# Patient Record
Sex: Female | Born: 2005 | Race: Black or African American | Hispanic: No | Marital: Single | Smoking: Current every day smoker
Health system: Southern US, Community
[De-identification: ages and names within clinical notes are randomized; demographics above are authoritative.]

## PROBLEM LIST (undated history)

## (undated) DIAGNOSIS — D649 Anemia, unspecified: Secondary | ICD-10-CM

## (undated) DIAGNOSIS — J45909 Unspecified asthma, uncomplicated: Secondary | ICD-10-CM

## (undated) DIAGNOSIS — Z8619 Personal history of other infectious and parasitic diseases: Secondary | ICD-10-CM

## (undated) HISTORY — PX: NO PAST SURGERIES: SHX2092

## (undated) HISTORY — DX: Personal history of other infectious and parasitic diseases: Z86.19

## (undated) HISTORY — PX: OTHER SURGICAL HISTORY: SHX169

---
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
## 2024-01-03 ENCOUNTER — Encounter: Payer: Medicaid Other | Admitting: Certified Nurse Midwife

## 2024-01-07 ENCOUNTER — Encounter: Payer: Self-pay | Admitting: Certified Nurse Midwife

## 2024-01-16 NOTE — Progress Notes (Signed)
 Chief Complaint:    Sonya Adams is a 18 y.o. female here for Nexplanono removal and discuss sono . Pt here for follow up for abd pain  and nexplanon removal and contraception counseling   u/s today :  Uterus ======   Visualized. Size 92 mm x 50 mm x 38 mm Normal Position: anteverted Malformations: none Myometrium: appears normal Endometrium: appears normal/appropriate. Endometrial thickness, total 5.1 mm Cervix details: nabothian cysts visualized No fibroids identified No polyps identified   Right Ovary =========   Visualized, Enlarged. Outline: Normal. Morphology: Appropriate. Size 44 mm x 28 mm x 22 mm No cysts identified Follicles identified   Left Ovary ========   Visualized, Enlarged. Outline: Normal. Morphology: Appropriate. Size 44 mm x 32 mm x 31 mm Follicles identified Cyst(s)    Size 36.0 mm x 23.0 mm x 22 mm. Mean 27.0 mm. Vol 9.538 cm. Simple cyst   Cul de Sac =========   Normal. No free fluid visualized   Normal. No free fluid visualized    Past Medical History:  has a past medical history of Asthma without status asthmaticus (HHS-HCC).  Past Surgical History:  has a past surgical history that includes LTCS (05/31/2022). Family History: family history is not on file. Social History:  reports that she has never smoked. She has never used smokeless tobacco. She reports that she does not currently use alcohol. She reports that she does not use drugs. OB/GYN History:  OB History     Gravida  1   Para  1   Term  1   Preterm      AB      Living  1      SAB      IAB      Ectopic      Molar      Multiple      Live Births  1          Allergies: has No Known Allergies. Medications:  Current Outpatient Medications:  .  etonogestreL (NEXPLANON) 68 mg implant, Inject 1 each into the skin once Placed 07/13/22, Disp: , Rfl:  .  hyoscyamine (LEVSIN) 0.125 mg tablet, 1 tab po q 4-8 hours prn, Disp: 30 tablet, Rfl: 0 .  albuterol  (PROVENTIL) 2.5 mg /3 mL (0.083 %) nebulizer solution, , Disp: , Rfl: 0 .  levonorgestrel-ethinyl estradiol (SEASONALE) 0.15 mg-30 mcg (91) tablet, Take 1 tablet by mouth once daily, Disp: 84 tablet, Rfl: 3 .  NIFEdipine (PROCARDIA-XL) 30 MG (OSM) XL tablet, Take 1 tablet (30 mg total) by mouth once daily (Patient not taking: Reported on 06/15/2022), Disp: 30 tablet, Rfl: 11 .  ondansetron  (ZOFRAN ) 4 MG tablet, Take 1 tablet (4 mg total) by mouth every 8 (eight) hours as needed for Nausea (Patient not taking: Reported on 01/13/2022), Disp: 30 tablet, Rfl: 0 .  oxyCODONE  (ROXICODONE ) 5 MG immediate release tablet, TAKE 1 TABLET BY MOUTH EVERY 4 HOURS AS NEEDED FOR UP TO 7 DAYS FOR MODERATE PAIN OR SEVERE PAIN. (Patient not taking: Reported on 07/13/2022), Disp: , Rfl:  .  prenatal vitamin with iron -folic acid (PRENATAL TABLETS) tablet, Take 1 tablet by mouth once daily (Patient not taking: Reported on 05/01/2022), Disp: , Rfl:   Review of Systems: General:   No fatigue or weight loss Eyes:   No vision changes Ears:   No hearing difficulty Respiratory:                No cough or shortness of breath  Pulmonary:   No asthma or shortness of breath Cardiovascular:        No chest pain, palpitations, dyspnea on exertion Gastrointestinal:          No abdominal bloating, chronic diarrhea, constipations, masses, pain or hematochezia Genitourinary:  No hematuria, dysuria, abnormal vaginal discharge, pelvic pain, Menometrorrhagia Lymphatic:  No swollen lymph nodes Musculoskeletal: No muscle weakness Neurologic:  No extremity weakness, syncope, seizure disorder Psychiatric:  No history of depression, delusions or suicidal/homicidal ideation    Exam:   Vitals:   01/16/24 1528  BP: 108/77  Pulse: 71    There is no height or weight on file to calculate BMI.  WDWN black female in NAD   Lungs: CTA  CV : RRR without murmur    Consent signed : /nexplanon removal: betadine  prep , injected with 1% lidocaine    With epinephrine  . 5 mm incision with #11 blade . Device retreived some  difficulty. ++ scarring around implant . Removed intact .Steri strips and bandaid placed    Impression:   The primary encounter diagnosis was Nexplanon removal. Diagnoses of Counseling for birth control, oral contraceptives, Abdominal pain, recurrent, and Left ovarian cyst were also pertinent to this visit.  Simple left ovarian cyst - benign appearing   Abdominal pain improving now on miralax   Plan:   Nexplanon removal as above  Start Seasonale ( no contraindication to use ) use discussed  Condoms first 30 days  Wound care discussed   No orders of the defined types were placed in this encounter.   No follow-ups on file.  Sonya CLARYCE DINSMORE, MD

## 2024-06-23 ENCOUNTER — Other Ambulatory Visit: Payer: Self-pay

## 2024-06-23 ENCOUNTER — Emergency Department

## 2024-06-23 ENCOUNTER — Inpatient Hospital Stay
Admission: EM | Admit: 2024-06-23 | Discharge: 2024-06-26 | DRG: 872 | Disposition: A | Discharge status: Home / Self Care | Attending: Student | Admitting: Student

## 2024-06-23 DIAGNOSIS — D509 Iron deficiency anemia, unspecified: Secondary | ICD-10-CM | POA: Diagnosis present

## 2024-06-23 DIAGNOSIS — N10 Acute tubulo-interstitial nephritis: Secondary | ICD-10-CM | POA: Diagnosis present

## 2024-06-23 DIAGNOSIS — Z98891 History of uterine scar from previous surgery: Secondary | ICD-10-CM

## 2024-06-23 DIAGNOSIS — E876 Hypokalemia: Secondary | ICD-10-CM | POA: Diagnosis present

## 2024-06-23 DIAGNOSIS — E559 Vitamin D deficiency, unspecified: Secondary | ICD-10-CM | POA: Diagnosis present

## 2024-06-23 DIAGNOSIS — B9620 Unspecified Escherichia coli [E. coli] as the cause of diseases classified elsewhere: Secondary | ICD-10-CM | POA: Diagnosis present

## 2024-06-23 DIAGNOSIS — N12 Tubulo-interstitial nephritis, not specified as acute or chronic: Principal | ICD-10-CM | POA: Diagnosis present

## 2024-06-23 DIAGNOSIS — A419 Sepsis, unspecified organism: Principal | ICD-10-CM | POA: Diagnosis present

## 2024-06-23 DIAGNOSIS — F17290 Nicotine dependence, other tobacco product, uncomplicated: Secondary | ICD-10-CM | POA: Diagnosis present

## 2024-06-23 LAB — BASIC METABOLIC PANEL WITH GFR
Anion gap: 9 (ref 5–15)
BUN: 12 mg/dL (ref 6–20)
CO2: 22 mmol/L (ref 22–32)
Calcium: 8.8 mg/dL — ABNORMAL LOW (ref 8.9–10.3)
Chloride: 109 mmol/L (ref 98–111)
Creatinine, Ser: 0.79 mg/dL (ref 0.44–1.00)
GFR, Estimated: >60 mL/min (ref >60)
Glucose, Bld: 113 mg/dL — ABNORMAL HIGH (ref 70–99)
Potassium: 3.4 mmol/L — ABNORMAL LOW (ref 3.5–5.1)
Sodium: 140 mmol/L (ref 135–145)

## 2024-06-23 LAB — URINALYSIS, ROUTINE W REFLEX MICROSCOPIC
Bilirubin Urine: NEGATIVE
Glucose, UA: NEGATIVE mg/dL
Ketones, ur: NEGATIVE mg/dL
Nitrite: NEGATIVE
Protein, ur: 100 mg/dL — AB
RBC / HPF: >50 RBC/hpf (ref 0–5)
Specific Gravity, Urine: 1.018 (ref 1.005–1.030)
WBC, UA: >50 WBC/hpf (ref 0–5)
pH: 5 (ref 5.0–8.0)

## 2024-06-23 LAB — CBC
HCT: 38.1 % (ref 36.0–46.0)
Hemoglobin: 12.7 g/dL (ref 12.0–15.0)
MCH: 30.3 pg (ref 26.0–34.0)
MCHC: 33.3 g/dL (ref 30.0–36.0)
MCV: 90.9 fL (ref 80.0–100.0)
Platelets: 220 K/uL (ref 150–400)
RBC: 4.19 MIL/uL (ref 3.87–5.11)
RDW: 13.7 % (ref 11.5–15.5)
WBC: 13.8 K/uL — ABNORMAL HIGH (ref 4.0–10.5)
nRBC: 0 % (ref 0.0–0.2)

## 2024-06-23 LAB — POC URINE PREG, ED: Preg Test, Ur: NEGATIVE

## 2024-06-23 MED ORDER — SODIUM CHLORIDE 0.9 % IV SOLN
12.5000 mg | Freq: Four times a day (QID) | INTRAVENOUS | Status: DC | PRN
  Administered 2024-06-23 – 2024-06-24 (×2): 12.5 mg via INTRAVENOUS
  Filled 2024-06-23: qty 12.5
  Filled 2024-06-23: qty 0.5
  Filled 2024-06-23: qty 12.5

## 2024-06-23 MED ORDER — LACTATED RINGERS IV SOLN: INTRAVENOUS | Status: DC

## 2024-06-23 MED ORDER — KETOROLAC TROMETHAMINE 30 MG/ML IJ SOLN
30.0000 mg | Freq: Once | INTRAMUSCULAR | Status: AC
  Administered 2024-06-23: 30 mg via INTRAVENOUS
  Filled 2024-06-23: qty 1

## 2024-06-23 MED ORDER — SODIUM CHLORIDE 0.9 % IV BOLUS
500.0000 mL | Freq: Once | INTRAVENOUS | Status: AC
  Administered 2024-06-23: 500 mL via INTRAVENOUS

## 2024-06-23 MED ORDER — ACETAMINOPHEN 500 MG PO TABS
1000.0000 mg | ORAL_TABLET | Freq: Once | ORAL | Status: AC
  Administered 2024-06-23: 1000 mg via ORAL
  Filled 2024-06-23: qty 2

## 2024-06-23 MED ORDER — FENTANYL CITRATE PF 50 MCG/ML IJ SOSY
50.0000 ug | PREFILLED_SYRINGE | Freq: Once | INTRAMUSCULAR | Status: AC
  Administered 2024-06-23: 50 ug via INTRAVENOUS
  Filled 2024-06-23: qty 1

## 2024-06-23 MED ORDER — SODIUM CHLORIDE 0.9 % IV SOLN
2.0000 g | INTRAVENOUS | Status: DC
  Administered 2024-06-24: 2 g via INTRAVENOUS
  Filled 2024-06-23 (×2): qty 20

## 2024-06-23 MED ORDER — SODIUM CHLORIDE 0.9 % IV SOLN
1.0000 g | Freq: Once | INTRAVENOUS | Status: AC
  Administered 2024-06-23: 1 g via INTRAVENOUS
  Filled 2024-06-23: qty 10

## 2024-06-23 MED ORDER — ACETAMINOPHEN 500 MG PO TABS
1000.0000 mg | ORAL_TABLET | Freq: Four times a day (QID) | ORAL | Status: DC
  Administered 2024-06-23 – 2024-06-26 (×8): 1000 mg via ORAL
  Filled 2024-06-23 (×9): qty 2

## 2024-06-23 MED ORDER — ONDANSETRON HCL 4 MG/2ML IJ SOLN
4.0000 mg | Freq: Once | INTRAMUSCULAR | Status: AC
  Administered 2024-06-23: 4 mg via INTRAVENOUS
  Filled 2024-06-23: qty 2

## 2024-06-23 MED ORDER — IBUPROFEN 400 MG PO TABS
600.0000 mg | ORAL_TABLET | Freq: Four times a day (QID) | ORAL | Status: DC | PRN
  Administered 2024-06-23: 600 mg via ORAL
  Filled 2024-06-23: qty 1

## 2024-06-23 MED ORDER — OXYCODONE HCL 5 MG PO TABS
5.0000 mg | ORAL_TABLET | ORAL | Status: DC | PRN
  Administered 2024-06-23 – 2024-06-26 (×6): 5 mg via ORAL
  Filled 2024-06-23 (×8): qty 1

## 2024-06-23 MED ORDER — ONDANSETRON HCL 4 MG/2ML IJ SOLN
4.0000 mg | Freq: Four times a day (QID) | INTRAMUSCULAR | Status: DC | PRN
Start: 2024-06-23 — End: 2024-06-26
  Administered 2024-06-23 – 2024-06-24 (×5): 4 mg via INTRAVENOUS
  Filled 2024-06-23 (×5): qty 2

## 2024-06-23 MED ORDER — ONDANSETRON HCL 4 MG PO TABS
4.0000 mg | ORAL_TABLET | Freq: Four times a day (QID) | ORAL | Status: DC | PRN
Start: 2024-06-23 — End: 2024-06-26

## 2024-06-23 MED ORDER — POTASSIUM CHLORIDE 20 MEQ PO PACK
40.0000 meq | PACK | Freq: Once | ORAL | Status: AC
  Administered 2024-06-23: 40 meq via ORAL
  Filled 2024-06-23: qty 2

## 2024-06-23 NOTE — ED Notes (Signed)
 Pt given cranberry juice, ice cup, and graham crackers.

## 2024-06-23 NOTE — ED Notes (Signed)
 Pt complaining of dry mouth. Pt given a mouth swab with a small amount of water.

## 2024-06-23 NOTE — H&P (Signed)
 History and Physical    Patient: Sonya Adams FMW:[id number] DOB: 08-02-2006 DOA: 06/23/2024 DOS: the patient was seen and examined on 06/23/2024 PCP: Patient, No Pcp Per  Patient coming from: Home  Chief Complaint:  Chief Complaint  Patient presents with   Dizziness   Flank Pain   HPI: Sonya Adams is a 18 y.o. female with no significant past medical history presents the emergency department for evaluation of right-sided flank pain that started about 6 days ago.  He says the pain is intermittent, radiates to the low back.  She has associated nausea, vomiting, fevers and chills.  She also reports some pelvic pain, however she is currently on her menstrual cycle.  She is unsure about hematuria, but denies any dysuria.   In the emergency department, she was tachycardic up to 120, blood pressure was low normal, patient asymptomatic, afebrile with leukocytosis of 13.8.  UA revealed a UTI and CT abdomen pelvis suggestive of right pyelonephritis without hydronephrosis or nephrolithiasis.  Review of Systems: Review of Systems  Constitutional:  Positive for chills and fever. Negative for diaphoresis, malaise/fatigue and weight loss.  Respiratory:  Negative for cough, sputum production and shortness of breath.   Cardiovascular:  Negative for chest pain, palpitations, claudication and leg swelling.  Gastrointestinal:  Positive for nausea and vomiting. Negative for abdominal pain, blood in stool, constipation, diarrhea and heartburn.  Genitourinary:  Positive for flank pain. Negative for dysuria, frequency, hematuria and urgency.  Musculoskeletal:  Positive for back pain. Negative for joint pain, myalgias and neck pain.  Skin:  Negative for itching and rash.  Neurological:  Negative for dizziness, tingling, tremors and headaches.  Psychiatric/Behavioral:  Negative for substance abuse and suicidal ideas. The patient is not nervous/anxious.     Past Medical History:  Diagnosis Date    Anemia    Asthma    History of chlamydia    Past Surgical History:  Procedure Laterality Date   CESAREAN SECTION  05/31/2022   Procedure: CESAREAN SECTION;  Surgeon: Schermerhorn, Debby PARAS, MD;  Location: [facility] ORS;  Service: Obstetrics;;   Denies surgical history     NO PAST SURGERIES     Social History:  reports that she has been smoking e-cigarettes. She has never used smokeless tobacco. She reports current alcohol use of about 14.0 standard drinks of alcohol per week. She reports current drug use. Frequency: 7.00 times per week. Drug: Marijuana.  No Known Allergies  Family History  Problem Relation Age of Onset   Healthy Paternal Grandfather    Healthy Paternal Grandmother    Cancer Maternal Grandmother    Healthy Maternal Grandfather    Healthy Father    Healthy Mother     Prior to Admission medications   Medication Sig Start Date End Date Taking? Authorizing Provider  acetaminophen  (TYLENOL ) 500 MG tablet Take 2 tablets (1,000 mg total) by mouth every 6 (six) hours. 06/02/22   Vernel Therisa HERO, CNM  ferrous sulfate  325 (65 FE) MG tablet Take 1 tablet (325 mg total) by mouth 2 (two) times daily with a meal. Patient not taking: Reported on 12/28/2022 06/02/22   Vernel Therisa HERO, CNM  ibuprofen  (ADVIL ) 600 MG tablet Take 1 tablet (600 mg total) by mouth every 6 (six) hours as needed for moderate pain or cramping. 06/02/22   Vernel Therisa HERO, CNM    Physical Exam: Vitals:   06/23/24 1300 06/23/24 1420 06/23/24 1430 06/23/24 1502  BP: 93/66 104/62 (!) 97/59   Pulse: (!) 107  98 98   Resp: 18 18 18    Temp:    98.7 F (37.1 C)  TempSrc:    Oral  SpO2: 96% 100% 100%   Weight:      Height:      Physical Exam Vitals and nursing note reviewed.  Constitutional:      General: She is not in acute distress.    Appearance: She is normal weight. She is not ill-appearing, toxic-appearing or diaphoretic.  HENT:     Head: Normocephalic and atraumatic.  Eyes:     Extraocular Movements:  Extraocular movements intact.     Pupils: Pupils are equal, round, and reactive to light.  Cardiovascular:     Rate and Rhythm: Regular rhythm. Tachycardia present.     Heart sounds: No murmur heard. Pulmonary:     Effort: Pulmonary effort is normal. No respiratory distress.     Breath sounds: Normal breath sounds.  Abdominal:     General: Abdomen is flat. There is no distension.     Palpations: Abdomen is soft.     Tenderness: There is no abdominal tenderness. There is right CVA tenderness. There is no left CVA tenderness.  Musculoskeletal:     Cervical back: Normal range of motion and neck supple.     Right lower leg: No edema.     Left lower leg: No edema.  Skin:    General: Skin is warm and dry.     Capillary Refill: Capillary refill takes less than 2 seconds.  Neurological:     General: No focal deficit present.     Mental Status: She is alert and oriented to person, place, and time. Mental status is at baseline.  Psychiatric:        Mood and Affect: Mood normal.        Behavior: Behavior normal.     Data Reviewed:    Labs on Admission: I have personally reviewed following labs and imaging studies  CBC: Recent Labs  Lab 06/23/24 1100  WBC 13.8*  HGB 12.7  HCT 38.1  MCV 90.9  PLT 220   Basic Metabolic Panel: Recent Labs  Lab 06/23/24 1100  NA 140  K 3.4*  CL 109  CO2 22  GLUCOSE 113*  BUN 12  CREATININE 0.79  CALCIUM  8.8*   GFR: Estimated Creatinine Clearance: 97.7 mL/min (by C-G formula based on SCr of 0.79 mg/dL). Liver Function Tests: No results for input(s): AST, ALT, ALKPHOS, BILITOT, PROT, ALBUMIN in the last 168 hours. No results for input(s): LIPASE, AMYLASE in the last 168 hours. No results for input(s): AMMONIA  in the last 168 hours. Coagulation Profile: No results for input(s): INR, PROTIME in the last 168 hours. Cardiac Enzymes: No results for input(s): CKTOTAL, CKMB, CKMBINDEX, TROPONINI in the last 168  hours. BNP (last 3 results) No results for input(s): PROBNP in the last 8760 hours. HbA1C: No results for input(s): HGBA1C in the last 72 hours. CBG: No results for input(s): GLUCAP in the last 168 hours. Lipid Profile: No results for input(s): CHOL, HDL, LDLCALC, TRIG, CHOLHDL, LDLDIRECT in the last 72 hours. Thyroid Function Tests: No results for input(s): TSH, T4TOTAL, FREET4, T3FREE, THYROIDAB in the last 72 hours. Anemia Panel: No results for input(s): VITAMINB12, FOLATE, FERRITIN, TIBC, IRON , RETICCTPCT in the last 72 hours. Urine analysis:    Component Value Date/Time   COLORURINE YELLOW (A) 06/23/2024 1100   APPEARANCEUR CLOUDY (A) 06/23/2024 1100   LABSPEC 1.018 06/23/2024 1100   PHURINE 5.0 06/23/2024 1100  GLUCOSEU NEGATIVE 06/23/2024 1100   HGBUR MODERATE (A) 06/23/2024 1100   BILIRUBINUR NEGATIVE 06/23/2024 1100   KETONESUR NEGATIVE 06/23/2024 1100   PROTEINUR 100 (A) 06/23/2024 1100   NITRITE NEGATIVE 06/23/2024 1100   LEUKOCYTESUR LARGE (A) 06/23/2024 1100    Radiological Exams on Admission: CT Renal Stone Study Result Date: 06/23/2024 CLINICAL DATA:  Abdominal pain, right-sided flank pain with nausea and vomiting EXAM: CT ABDOMEN AND PELVIS WITHOUT CONTRAST TECHNIQUE: Multidetector CT imaging of the abdomen and pelvis was performed following the standard protocol without IV contrast. RADIATION DOSE REDUCTION: This exam was performed according to the departmental dose-optimization program which includes automated exposure control, adjustment of the mA and/or kV according to patient size and/or use of iterative reconstruction technique. COMPARISON:  None Available. FINDINGS: lower chest: Unremarkable. Hepatobiliary: No suspicious liver lesion. No gallstones, gallbladder wall thickening, or biliary dilatation. Pancreas: Unremarkable. Spleen: Unremarkable. Adrenals/Urinary Tract: Adrenal glands are unremarkable. Nonspecific right  perinephric stranding. No nephrolithiasis or hydronephrosis. Stomach/Bowel: No evidence of bowel obstruction or inflammation. Appendix is not well visualized. Vascular/Lymphatic: Normal caliber aorta. No lymphadenopathy by size criteria. Reproductive: Unremarkable. Other: No free air or free fluid. Musculoskeletal: No acute osseous findings. IMPRESSION: No hydronephrosis or nephrolithiasis. Nonspecific right perinephric stranding may represent pyelonephritis. Recommend correlation with urinalysis. Electronically Signed   By: Michaeline Blanch M.D.   On: 06/23/2024 12:56       Assessment and Plan: No notes have been filed under this hospital service. Service: Hospitalist  18 year old otherwise healthy female admitted for management of acute right sided pyelonephritis  Sepsis secondary to Acute Pyelonephritis  - SIRS criteria met with UTI, tachycardia, leukocytosis.  Hemodynamically stable.  CT without hydro or nephrolithiasis - Follow blood and urine cultures - Rocephin , maintenance IV fluid - Pain control   SCDs/early ambulation  Regular diet  LR@100  Monitor/replace electrolytes   Advance Care Planning:   Code Status: Full Code      Severity of Illness: The appropriate patient status for this patient is OBSERVATION. Observation status is judged to be reasonable and necessary in order to provide the required intensity of service to ensure the patient's safety. The patient's presenting symptoms, physical exam findings, and initial radiographic and laboratory data in the context of their medical condition is felt to place them at decreased risk for further clinical deterioration. Furthermore, it is anticipated that the patient will be medically stable for discharge from the hospital within 2 midnights of admission.   Author: Daved JAYSON Pump, DO 06/23/2024 3:05 PM  For on call review [email or web address].

## 2024-06-23 NOTE — ED Provider Notes (Signed)
 [facility] Provider Note    Event Date/Time   First MD Initiated Contact with Patient 06/23/24 1112     (approximate)   History   Dizziness and Flank Pain   HPI  Sonya Adams is a 18 y.o. female with history of a C-section who presents with complaints of right flank and back pain.  She reports this has been ongoing for couple of days.  No fevers reported.  Denies dysuria     Physical Exam   Triage Vital Signs: ED Triage Vitals [06/23/24 1057]  Encounter Vitals Group     BP 108/74     Girls Systolic BP Percentile      Girls Diastolic BP Percentile      Boys Systolic BP Percentile      Boys Diastolic BP Percentile      Pulse Rate (!) 103     Resp 18     Temp 99.1 F (37.3 C)     Temp Source Oral     SpO2 99 %     Weight 64.9 kg (143 lb)     Height 1.626 m (5' 4)     Head Circumference      Peak Flow      Pain Score 10     Pain Loc      Pain Education      Exclude from Growth Chart     Most recent vital signs: Vitals:   06/23/24 1230 06/23/24 1300  BP: 113/78 93/66  Pulse: (!) 103 (!) 107  Resp:  18  Temp:    SpO2: 100% 96%     General: Awake, no distress.  CV:  Good peripheral perfusion.  Resp:  Normal effort.  Abd:  No distention.  Right-sided CVA tenderness Other:     ED Results / Procedures / Treatments   Labs (all labs ordered are listed, but only abnormal results are displayed) Labs Reviewed  URINALYSIS, ROUTINE W REFLEX MICROSCOPIC - Abnormal; Notable for the following components:      Result Value   Color, Urine YELLOW (*)    APPearance CLOUDY (*)    Hgb urine dipstick MODERATE (*)    Protein, ur 100 (*)    Leukocytes,Ua LARGE (*)    Bacteria, UA MANY (*)    All other components within normal limits  BASIC METABOLIC PANEL WITH GFR - Abnormal; Notable for the following components:   Potassium 3.4 (*)    Glucose, Bld 113 (*)    Calcium  8.8 (*)    All other components within normal limits  CBC -  Abnormal; Notable for the following components:   WBC 13.8 (*)    All other components within normal limits  POC URINE PREG, ED     EKG  ED ECG REPORT I, Lamar Price, the attending physician, personally viewed and interpreted this ECG.  Date: 06/23/2024  Rhythm: normal sinus rhythm QRS Axis: normal Intervals: normal ST/T Wave abnormalities: normal Narrative Interpretation: no evidence of acute ischemia    RADIOLOGY CT scan demonstrates stranding around the right kidney    PROCEDURES:  Critical Care performed:   Procedures   MEDICATIONS ORDERED IN ED: Medications  sodium chloride  0.9 % bolus 500 mL (0 mLs Intravenous Stopped 06/23/24 1305)  ketorolac  (TORADOL ) 30 MG/ML injection 30 mg (30 mg Intravenous Given 06/23/24 1205)  cefTRIAXone  (ROCEPHIN ) 1 g in sodium chloride  0.9 % 100 mL IVPB (0 g Intravenous Stopped 06/23/24 1304)  ondansetron  (ZOFRAN ) injection 4 mg (4  mg Intravenous Given 06/23/24 1244)     IMPRESSION / MDM / ASSESSMENT AND PLAN / ED COURSE  I reviewed the triage vital signs and the nursing notes. Patient's presentation is most consistent with acute presentation with potential threat to life or bodily function.   Patient presents with right-sided back and flank pain as detailed above, differential includes pyelonephritis, ureterolithiasis, exam is not consistent with appendicitis or cholecystitis.  Lab work demonstrates elevated white blood cell count, urinalysis is concerning for possible infection, pending CT renal stone study  ----------------------------------------- 1:52 PM on 06/23/2024 ----------------------------------------- Lab work demonstrates elevated white blood cell count, she is tachycardic with elevated temperature and urinalysis consistent with infection, she has stranding on CT consistent with pyelonephritis and this is where her pain continues to be, will discuss with the hospitalist for admission     FINAL CLINICAL  IMPRESSION(S) / ED DIAGNOSES   Final diagnoses:  Pyelonephritis     Rx / DC Orders   ED Discharge Orders     None        Note:  This document was prepared using Dragon voice recognition software and may include unintentional dictation errors.   Arlander Charleston, MD 06/23/24 1353

## 2024-06-23 NOTE — ED Notes (Signed)
 Pt was unable to tolerate Potassium supplement. Had about a quarter of a dose

## 2024-06-23 NOTE — ED Triage Notes (Signed)
 Pt to ED from home with dizziness and right sided flank pain x4 days with N/V since this morning. Pt denies SOB/CP/ urinary symptoms. A&O x4

## 2024-06-24 DIAGNOSIS — N10 Acute tubulo-interstitial nephritis: Secondary | ICD-10-CM | POA: Diagnosis present

## 2024-06-24 DIAGNOSIS — D509 Iron deficiency anemia, unspecified: Secondary | ICD-10-CM | POA: Diagnosis present

## 2024-06-24 DIAGNOSIS — A419 Sepsis, unspecified organism: Secondary | ICD-10-CM | POA: Diagnosis present

## 2024-06-24 DIAGNOSIS — F17290 Nicotine dependence, other tobacco product, uncomplicated: Secondary | ICD-10-CM | POA: Diagnosis present

## 2024-06-24 DIAGNOSIS — E876 Hypokalemia: Secondary | ICD-10-CM | POA: Diagnosis present

## 2024-06-24 DIAGNOSIS — E559 Vitamin D deficiency, unspecified: Secondary | ICD-10-CM | POA: Diagnosis present

## 2024-06-24 DIAGNOSIS — B9620 Unspecified Escherichia coli [E. coli] as the cause of diseases classified elsewhere: Secondary | ICD-10-CM | POA: Diagnosis present

## 2024-06-24 DIAGNOSIS — Z98891 History of uterine scar from previous surgery: Secondary | ICD-10-CM | POA: Diagnosis not present

## 2024-06-24 DIAGNOSIS — N12 Tubulo-interstitial nephritis, not specified as acute or chronic: Secondary | ICD-10-CM | POA: Diagnosis present

## 2024-06-24 LAB — CBC
HCT: 33.6 % — ABNORMAL LOW (ref 36.0–46.0)
Hemoglobin: 11.3 g/dL — ABNORMAL LOW (ref 12.0–15.0)
MCH: 30.7 pg (ref 26.0–34.0)
MCHC: 33.6 g/dL (ref 30.0–36.0)
MCV: 91.3 fL (ref 80.0–100.0)
Platelets: 167 K/uL (ref 150–400)
RBC: 3.68 MIL/uL — ABNORMAL LOW (ref 3.87–5.11)
RDW: 13.8 % (ref 11.5–15.5)
WBC: 13.5 K/uL — ABNORMAL HIGH (ref 4.0–10.5)
nRBC: 0 % (ref 0.0–0.2)

## 2024-06-24 LAB — BASIC METABOLIC PANEL WITH GFR
Anion gap: 6 (ref 5–15)
BUN: 11 mg/dL (ref 6–20)
CO2: 23 mmol/L (ref 22–32)
Calcium: 8.4 mg/dL — ABNORMAL LOW (ref 8.9–10.3)
Chloride: 107 mmol/L (ref 98–111)
Creatinine, Ser: 0.77 mg/dL (ref 0.44–1.00)
GFR, Estimated: >60 mL/min (ref >60)
Glucose, Bld: 113 mg/dL — ABNORMAL HIGH (ref 70–99)
Potassium: 3.1 mmol/L — ABNORMAL LOW (ref 3.5–5.1)
Sodium: 136 mmol/L (ref 135–145)

## 2024-06-24 LAB — HIV ANTIBODY (ROUTINE TESTING W REFLEX): HIV Screen 4th Generation wRfx: NONREACTIVE

## 2024-06-24 LAB — GLUCOSE, CAPILLARY: Glucose-Capillary: 107 mg/dL — ABNORMAL HIGH (ref 70–99)

## 2024-06-24 MED ORDER — KETOROLAC TROMETHAMINE 15 MG/ML IJ SOLN
15.0000 mg | Freq: Four times a day (QID) | INTRAMUSCULAR | Status: DC | PRN
  Administered 2024-06-24 – 2024-06-25 (×3): 15 mg via INTRAVENOUS
  Filled 2024-06-24 (×3): qty 1

## 2024-06-24 MED ORDER — POTASSIUM CHLORIDE 10 MEQ/100ML IV SOLN
10.0000 meq | INTRAVENOUS | Status: AC
  Administered 2024-06-24 (×2): 10 meq via INTRAVENOUS
  Filled 2024-06-24 (×3): qty 100

## 2024-06-24 NOTE — Plan of Care (Signed)
   Problem: Safety: Goal: Ability to remain free from injury will improve Outcome: Progressing

## 2024-06-24 NOTE — Progress Notes (Signed)
  Progress Note   Patient: Sonya Adams FMW:[id number] DOB: [birth year] DOA: 06/23/2024     0 DOS: the patient was seen and examined on 06/24/2024   Brief hospital course:  Sonya Adams is a 18 y.o. female with no significant past medical history presents the emergency department for evaluation of right-sided flank pain that started about 6 days ago.  He says the pain is intermittent, radiates to the low back.  She has associated nausea, vomiting, fevers and chills. ... See H&P for full HPI on admission & ED course.  Patient was admitted and started on empiric IV antibiotics for sepsis due to acute right-sided pyelonephritis pending urine culture results.   Assessment and Plan:  Sepsis secondary to Acute Pyelonephritis  Sepsis evidenced by tachycardia, leukocytosis present on admission. --Continue empiric IV Rocephin  --Follow blood and urine cultures --Pain control PRN --Continue IV fluids --Monitor fever curve, CBC, hemodynamics  Hypokalemia - K 3.4 on admission, oral replacement ordered by reportedly pt did not tolerate it.  K this AM is 3.1. --Replace with IV K-riders x 3 with IV fluids --Repeat BMP in AM     Subjective: Pt seen with boyfriend at bedside, grandmother arrived during my encounter.  Pt reports ongoing right flank pain and nausea this AM, was vomiting earlier.  Pt wants to go home today.  She describes several issues overnight related to shared room and frustration.    Physical Exam: Vitals:   06/23/24 1958 06/24/24 0505 06/24/24 0835 06/24/24 1510  BP: (!) 98/59 103/63 (!) 101/53 105/60  Pulse: 78 88 78 91  Resp: 16 18 16 16   Temp: 98.3 F (36.8 C)  98.2 F (36.8 C) 99.4 F (37.4 C)  TempSrc:   Oral   SpO2: 100% 99% 99% 97%  Weight:      Height:       General exam: awake, alert, no acute distress HEENT: moist mucus membranes, hearing grossly normal  Respiratory system: CTAB, no wheezes, rales or rhonchi, normal respiratory  effort. Cardiovascular system: normal S1/S2, RRR, no pedal edema.   Gastrointestinal system: soft, NT, ND Central nervous system: A&O x3. no gross focal neurologic deficits, normal speech Extremities: moves all, no edema, normal tone Skin: dry, intact, normal temperature Psychiatry: irritable mood, congruent affect, judgement and insight appear normal   Data Reviewed:  Notable labs --  WBC 13.8 >> 13.5, Hbg 11.3, K 3.1, glucose 113, ca 8.4  Family Communication: at bedside on rounds  Disposition: Status is: Inpatient Remains appropriate due to -- empiric IV antibiotics pending urine cultures   Planned Discharge Destination: Home    Time spent: 40 minutes  Author: Burnard DELENA Cunning, DO 06/24/2024 4:04 PM  For on call review [email or web address].

## 2024-06-24 NOTE — Plan of Care (Signed)

## 2024-06-24 NOTE — Progress Notes (Signed)
 While in the pt's room at the bedside the pt verbalized that her stomach hurt; she forcefully started dry heaving and then had white mucous in the emesis bag; to advise attending physician, Dr Fausto

## 2024-06-25 ENCOUNTER — Other Ambulatory Visit: Payer: Self-pay

## 2024-06-25 DIAGNOSIS — N10 Acute tubulo-interstitial nephritis: Secondary | ICD-10-CM | POA: Diagnosis not present

## 2024-06-25 LAB — BASIC METABOLIC PANEL WITH GFR
Anion gap: 10 (ref 5–15)
BUN: 9 mg/dL (ref 6–20)
CO2: 23 mmol/L (ref 22–32)
Calcium: 8.5 mg/dL — ABNORMAL LOW (ref 8.9–10.3)
Chloride: 106 mmol/L (ref 98–111)
Creatinine, Ser: 0.83 mg/dL (ref 0.44–1.00)
GFR, Estimated: >60 mL/min (ref >60)
Glucose, Bld: 83 mg/dL (ref 70–99)
Potassium: 3.2 mmol/L — ABNORMAL LOW (ref 3.5–5.1)
Sodium: 139 mmol/L (ref 135–145)

## 2024-06-25 LAB — CBC
HCT: 36 % (ref 36.0–46.0)
Hemoglobin: 12.1 g/dL (ref 12.0–15.0)
MCH: 30.6 pg (ref 26.0–34.0)
MCHC: 33.6 g/dL (ref 30.0–36.0)
MCV: 90.9 fL (ref 80.0–100.0)
Platelets: 194 K/uL (ref 150–400)
RBC: 3.96 MIL/uL (ref 3.87–5.11)
RDW: 14 % (ref 11.5–15.5)
WBC: 11.6 K/uL — ABNORMAL HIGH (ref 4.0–10.5)
nRBC: 0 % (ref 0.0–0.2)

## 2024-06-25 LAB — URINE CULTURE: Culture: 70000 — AB

## 2024-06-25 LAB — FOLATE: Folate: 8.6 ng/mL (ref >5.9)

## 2024-06-25 LAB — IRON AND TIBC
Iron: 10 ug/dL — ABNORMAL LOW (ref 28–170)
Saturation Ratios: 6 % — ABNORMAL LOW (ref 10.4–31.8)
TIBC: 179 ug/dL — ABNORMAL LOW (ref 250–450)
UIBC: 169 ug/dL

## 2024-06-25 LAB — MAGNESIUM: Magnesium: 1.6 mg/dL — ABNORMAL LOW (ref 1.7–2.4)

## 2024-06-25 LAB — VITAMIN B12: Vitamin B-12: 313 pg/mL (ref 180–914)

## 2024-06-25 LAB — VITAMIN D 25 HYDROXY (VIT D DEFICIENCY, FRACTURES): Vit D, 25-Hydroxy: 10.54 ng/mL — ABNORMAL LOW (ref 30–100)

## 2024-06-25 LAB — PHOSPHORUS: Phosphorus: 3.1 mg/dL (ref 2.5–4.6)

## 2024-06-25 MED ORDER — CEFADROXIL 500 MG PO CAPS
1000.0000 mg | ORAL_CAPSULE | Freq: Two times a day (BID) | ORAL | Status: DC
  Administered 2024-06-25 – 2024-06-26 (×3): 1000 mg via ORAL
  Filled 2024-06-25 (×3): qty 2

## 2024-06-25 MED ORDER — PANTOPRAZOLE SODIUM 40 MG IV SOLR
40.0000 mg | Freq: Once | INTRAVENOUS | Status: AC
Start: 2024-06-25 — End: 2024-06-25
  Administered 2024-06-25: 40 mg via INTRAVENOUS
  Filled 2024-06-25: qty 10

## 2024-06-25 MED ORDER — POLYSACCHARIDE IRON COMPLEX 150 MG PO CAPS
150.0000 mg | ORAL_CAPSULE | Freq: Every day | ORAL | Status: DC
  Administered 2024-06-25 – 2024-06-26 (×2): 150 mg via ORAL
  Filled 2024-06-25 (×2): qty 1

## 2024-06-25 MED ORDER — VITAMIN D (ERGOCALCIFEROL) 1.25 MG (50000 UNIT) PO CAPS
50000.0000 [IU] | ORAL_CAPSULE | ORAL | Status: DC
  Administered 2024-06-25: 50000 [IU] via ORAL
  Filled 2024-06-25: qty 1

## 2024-06-25 MED ORDER — CYANOCOBALAMIN 500 MCG PO TABS
500.0000 ug | ORAL_TABLET | Freq: Every day | ORAL | Status: DC
  Administered 2024-06-25 – 2024-06-26 (×2): 500 ug via ORAL
  Filled 2024-06-25 (×2): qty 1

## 2024-06-25 MED ORDER — LACTATED RINGERS IV SOLN: INTRAVENOUS | Status: DC

## 2024-06-25 MED ORDER — POTASSIUM CHLORIDE CRYS ER 20 MEQ PO TBCR
40.0000 meq | EXTENDED_RELEASE_TABLET | ORAL | Status: AC
  Administered 2024-06-25 (×2): 40 meq via ORAL
  Filled 2024-06-25 (×2): qty 2

## 2024-06-25 MED ORDER — VITAMIN C 500 MG PO TABS
500.0000 mg | ORAL_TABLET | Freq: Every day | ORAL | Status: DC
  Administered 2024-06-25 – 2024-06-26 (×2): 500 mg via ORAL
  Filled 2024-06-25 (×2): qty 1

## 2024-06-25 MED ORDER — ASCORBIC ACID 500 MG PO TABS
500.0000 mg | ORAL_TABLET | Freq: Every day | ORAL | 2 refills | Status: AC
  Filled 2024-06-25: qty 30, 30d supply, fill #0

## 2024-06-25 MED ORDER — POLYSACCHARIDE IRON COMPLEX 150 MG PO CAPS
150.0000 mg | ORAL_CAPSULE | Freq: Every day | ORAL | 2 refills | Status: AC
  Filled 2024-06-25: qty 30, 30d supply, fill #0

## 2024-06-25 MED ORDER — CEFADROXIL 500 MG PO CAPS
1000.0000 mg | ORAL_CAPSULE | Freq: Two times a day (BID) | ORAL | 0 refills | Status: AC
  Filled 2024-06-25: qty 24, 6d supply, fill #0

## 2024-06-25 MED ORDER — MAGNESIUM SULFATE 2 GM/50ML IV SOLN
2.0000 g | Freq: Once | INTRAVENOUS | Status: AC
  Administered 2024-06-25: 2 g via INTRAVENOUS
  Filled 2024-06-25: qty 50

## 2024-06-25 NOTE — Progress Notes (Signed)
  Progress Note   Patient: Sonya Adams FMW:[id number] DOB: [birth year] DOA: 06/23/2024     1 DOS: the patient was seen and examined on 06/25/2024   Brief hospital course:  Sonya Adams is a 18 y.o. female with no significant past medical history presents the emergency department for evaluation of right-sided flank pain that started about 6 days ago.  He says the pain is intermittent, radiates to the low back.  She has associated nausea, vomiting, fevers and chills. ... See H&P for full HPI on admission & ED course.  Patient was admitted and started on empiric IV antibiotics for sepsis due to acute right-sided pyelonephritis pending urine culture results.   Assessment and Plan:  Sepsis secondary to Acute Pyelonephritis  Sepsis evidenced by tachycardia, leukocytosis present on admission. Urine culture growing E. coli, pansensitive S/p ceftriaxone , transition to cefadroxil  1 g p.o. twice daily for total 7 days. Blood culture NGTD --Pain control PRN --Continue IV fluids --Monitor fever curve, CBC, hemodynamics   # Hypokalemia - K 3.4 on admission, oral replacement ordered by reportedly pt did not tolerate it.  K this AM is 3.1. --Replace with IV K-riders x 3 with IV fluids -- K 3.2, 40 mEq x 2 doses given on 7/15 --Repeat BMP in AM  # Hypomagnesemia, mag repleted. Monitor electrolytes and replete as needed.  # Iron  deficiency anemia, transferrin saturation 6%, started oral iron  supplement with vitamin C  Folic acid level 8.6, within normal range  # Vitamin D  deficiency: started vitamin D  50,000 units p.o. weekly, follow with PCP to repeat vitamin D  level after 3 to 6 months.  # Vitamin B12 level 313, goal >400: Started B12 oral supplement to prevent deficiency. Follow-up PCP to repeat vitamin B12 level after 3 to 6 months.    Subjective: Pt seen with boyfriend at bedside, still patient is feeling weak and tiredness, right flank tenderness.  Still on liquid diet, would  like to try regular food. Due to electrolyte imbalance and decreased p.o. intake we will continue to monitor today and plan is to discharge tomorrow a.m. on oral antibiotics.   Physical Exam: Vitals:   06/24/24 1510 06/24/24 2104 06/24/24 2219 06/25/24 0432  BP: 105/60 110/72 119/74 104/79  Pulse: 91 95 (!) 107 (!) 102  Resp: 16 20 18 20   Temp: 99.4 F (37.4 C) 99.2 F (37.3 C) 99.2 F (37.3 C) 98 F (36.7 C)  TempSrc:  Oral  Oral  SpO2: 97% 100% 100% 98%  Weight:      Height:       General exam: awake, alert, no acute distress HEENT: moist mucus membranes, hearing grossly normal  Respiratory system: CTAB, no wheezes, rales or rhonchi, normal respiratory effort. Cardiovascular system: normal S1/S2, RRR, no pedal edema.   Gastrointestinal system: soft, NT, ND Central nervous system: A&O x3. no gross focal neurologic deficits, normal speech Extremities: moves all, no edema, normal tone Skin: dry, intact, normal temperature Psychiatry: irritable mood, congruent affect, judgement and insight appear normal   Data Reviewed:  Notable labs --  WBC 13.8 >> 13.5, Hbg 11.3, K 3.1, glucose 113, ca 8.4  Family Communication: at bedside on rounds  Disposition: Status is: Inpatient Remains appropriate due to electrolyte imbalance and poor oral intake, diet advanced today.  Plan is to discharge tomorrow a.m.  On oral antibiotics   Planned Discharge Destination: Home    Time spent: 55 minutes  Author: Elvan Sor, MD 06/25/2024 2:24 PM  For on call review [email or web address].

## 2024-06-25 NOTE — Plan of Care (Signed)
   Problem: Activity: Goal: Risk for activity intolerance will decrease Outcome: Progressing

## 2024-06-25 NOTE — TOC Initial Note (Signed)
 Transition of Care [facility] - Initial/Assessment Note    Patient Details  Name: Sonya Adams MRN: [id number] Date of Birth: May 05, 2006  Transition of [facility]) CM/SW Contact:    Dalia GORMAN Fuse, RN Phone Number: 06/25/2024, 1:16 PM  Clinical Narrative:                 Patient is from home independently. She drives and has no difficulty getting to appts or obtaining medications. She doesn't have a PCP, but uses [facility] in [place]. No DME is noted. Her boyfriend will transport her at discharge.  TOC placed a list of PCPs on the patient's AVS.  No other TOC needs        Patient Goals and CMS Choice            Expected Discharge Plan and Services         Expected Discharge Date: 06/25/24                                    Prior Living Arrangements/Services                       Activities of Daily Living   ADL Screening (condition at time of admission) Independently performs ADLs?: Yes (appropriate for developmental age) Is the patient deaf or have difficulty hearing?: No Does the patient have difficulty seeing, even when wearing glasses/contacts?: No Does the patient have difficulty concentrating, remembering, or making decisions?: No  Permission Sought/Granted                  Emotional Assessment              Admission diagnosis:  Pyelonephritis [N12] Acute pyelonephritis [N10] Patient Active Problem List   Diagnosis Date Noted   Hypokalemia 06/24/2024   Acute pyelonephritis 06/23/2024   Pyelonephritis 06/23/2024   Vapes nicotine containing substance 07/15/2020   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 [place] [address] Phone: [phone] Fax: [phone]  [id number] [place], [place] - [address] [place] [address] Phone: [phone]  Fax: [phone]     Social Drivers of Health (SDOH) Social History: SDOH Screenings   Food Insecurity: No Food Insecurity (06/23/2024)  Housing: Low Risk  (06/23/2024)  Transportation Needs: No Transportation Needs (06/23/2024)  Utilities: Not At Risk (06/23/2024)  Depression (PHQ2-9): Low Risk  (03/24/2021)  Social Connections: Unknown (06/23/2024)  Tobacco Use: High Risk (06/23/2024)   SDOH Interventions:     Readmission Risk Interventions     No data to display

## 2024-06-25 NOTE — Discharge Instructions (Signed)
 Some PCP options in [place] area- not a comprehensive list  [facility]- [phone] [facility]- [phone] [facility]- [phone] [facility]- [phone] [facility]- [phone] [facility]- [phone]  or [facility] Physician Referral Line [phone]

## 2024-06-26 ENCOUNTER — Other Ambulatory Visit: Payer: Self-pay

## 2024-06-26 DIAGNOSIS — N10 Acute tubulo-interstitial nephritis: Secondary | ICD-10-CM | POA: Diagnosis not present

## 2024-06-26 LAB — CBC
HCT: 29.2 % — ABNORMAL LOW (ref 36.0–46.0)
Hemoglobin: 9.7 g/dL — ABNORMAL LOW (ref 12.0–15.0)
MCH: 30.1 pg (ref 26.0–34.0)
MCHC: 33.2 g/dL (ref 30.0–36.0)
MCV: 90.7 fL (ref 80.0–100.0)
Platelets: 162 K/uL (ref 150–400)
RBC: 3.22 MIL/uL — ABNORMAL LOW (ref 3.87–5.11)
RDW: 13.9 % (ref 11.5–15.5)
WBC: 5.2 K/uL (ref 4.0–10.5)
nRBC: 0 % (ref 0.0–0.2)

## 2024-06-26 LAB — BASIC METABOLIC PANEL WITH GFR
Anion gap: 4 — ABNORMAL LOW (ref 5–15)
BUN: 10 mg/dL (ref 6–20)
CO2: 24 mmol/L (ref 22–32)
Calcium: 8.1 mg/dL — ABNORMAL LOW (ref 8.9–10.3)
Chloride: 111 mmol/L (ref 98–111)
Creatinine, Ser: 0.74 mg/dL (ref 0.44–1.00)
GFR, Estimated: >60 mL/min
Glucose, Bld: 97 mg/dL (ref 70–99)
Potassium: 3.8 mmol/L (ref 3.5–5.1)
Sodium: 139 mmol/L (ref 135–145)

## 2024-06-26 LAB — PHOSPHORUS: Phosphorus: 3.1 mg/dL (ref 2.5–4.6)

## 2024-06-26 LAB — MAGNESIUM: Magnesium: 2 mg/dL (ref 1.7–2.4)

## 2024-06-26 MED ORDER — POLYETHYLENE GLYCOL 3350 17 G PO PACK
17.0000 g | PACK | Freq: Two times a day (BID) | ORAL | Status: DC
  Administered 2024-06-26: 17 g via ORAL
  Filled 2024-06-26: qty 1

## 2024-06-26 MED ORDER — OXYCODONE HCL 5 MG PO TABS
5.0000 mg | ORAL_TABLET | Freq: Three times a day (TID) | ORAL | 0 refills | Status: AC | PRN
  Filled 2024-06-26: qty 10, 4d supply, fill #0

## 2024-06-26 MED ORDER — BISACODYL 10 MG RE SUPP: 10.0000 mg | Freq: Every day | RECTAL | Status: DC | PRN

## 2024-06-26 MED ORDER — BISACODYL 5 MG PO TBEC: 10.0000 mg | DELAYED_RELEASE_TABLET | Freq: Every day | ORAL | Status: DC

## 2024-06-26 MED ORDER — VITAMIN D (ERGOCALCIFEROL) 1.25 MG (50000 UNIT) PO CAPS
50000.0000 [IU] | ORAL_CAPSULE | ORAL | 0 refills | Status: AC
  Filled 2024-06-26: qty 12, 84d supply, fill #0

## 2024-06-26 MED ORDER — CYANOCOBALAMIN 500 MCG PO TABS
500.0000 ug | ORAL_TABLET | Freq: Every day | ORAL | 2 refills | Status: AC
  Filled 2024-06-26: qty 30, 30d supply, fill #0

## 2024-06-26 MED ORDER — BISACODYL 5 MG PO TBEC
10.0000 mg | DELAYED_RELEASE_TABLET | Freq: Once | ORAL | Status: AC
  Administered 2024-06-26: 10 mg via ORAL
  Filled 2024-06-26: qty 2

## 2024-06-26 NOTE — Discharge Summary (Signed)
 [facility] Discharge Summary   Patient: Sonya Adams FMW:[id number]  PCP: Sonya Adams  Date of admission: 06/23/2024   Date of discharge:  06/26/2024     Discharge Diagnoses:  Principal Problem:   Acute pyelonephritis Active Problems:   Pyelonephritis   Hypokalemia   Admitted From: Home Disposition:  Home   Recommendations for Outpatient Follow-up:  PCP: In 1 week Follow up LABS/TEST: BMP in 1 week.  Iron  profile, vitamin D  level and vitamin B12 level in between 3 to 6 months   Follow-up Information     PCP Follow up in 1 week(s).                 Diet recommendation: Regular diet  Activity: The patient is advised to gradually reintroduce usual activities, as tolerated  Discharge Condition: stable  Code Status: Full code   History of present illness: As Adams the H and P dictated on admission.  Hospital Course:  Sonya Adams is a 18 y.o. female with no significant past medical history presents the emergency department for evaluation of right-sided flank pain that started about 6 days ago.  He says the pain is intermittent, radiates to the low back.  She has associated nausea, vomiting, fevers and chills. ... See H&P for full HPI on admission & ED course.   Patient was admitted and started on empiric IV antibiotics for sepsis due to acute right-sided pyelonephritis pending urine culture results.     Assessment and Plan:   Sepsis secondary to Acute Pyelonephritis  Sepsis evidenced by tachycardia, leukocytosis present on admission. Urine culture growing E. coli, pansensitive S/p ceftriaxone , transition to cefadroxil  1 g p.o. twice daily for total 7 days. Blood culture NGTD. S/p IVF given, recommended to continue oral hydration at home.  Patient was requesting for stronger pain medication, stated Tylenol  is not helping.  So oxycodone  as needed for pain control prescribed.  # Hypokalemia: Potassium repleted and resolved # Hypomagnesemia,  mag repleted and resolved # Iron  deficiency anemia, transferrin saturation 6%, started oral iron  supplement with vitamin C . Folic acid level 8.6, within normal range.  Follow-up with PCP to repeat iron  profile in 3 to 6 months.   # Vitamin D  deficiency: started vitamin D  50,000 units p.o. weekly, follow with PCP to repeat vitamin D  level after 3 to 6 months.   # Vitamin B12 level 313, goal >400: Started B12 oral supplement to prevent deficiency. Follow-up PCP to repeat vitamin B12 level after 3 to 6 months.    Body mass index is 24.55 kg/m.  Nutrition Interventions:  Pain control  - [place]  Controlled Substance Reporting System database was reviewed. - Oxycodone  10 tablets prescribed prn for pain control  - Patient was instructed, not to drive, operate heavy machinery, perform activities at heights, swimming or participation in water activities or provide baby sitting services while on Pain, Sleep and Anxiety Medications; until her outpatient Physician has advised to do so again.  - Also recommended to not to take more than prescribed Pain, Sleep and Anxiety Medications.  Patient was ambulatory without any assistance. On the day of the discharge the patient's vitals were stable, and no other acute medical condition were reported by patient. the patient was felt safe to be discharge at Home.  Consultants: None Procedures: None  Discharge Exam: General: Appear in no distress, no Rash; Oral Mucosa Clear, moist. Cardiovascular: S1 and S2 Present, no Murmur, Respiratory: normal respiratory effort, Bilateral Air entry present and no Crackles,  no wheezes Abdomen: BS present, Soft and mild left flank tenderness. Extremities: no Pedal edema, no calf tenderness Neurology: alert and oriented to time, place, and person affect appropriate.  Filed Weights   06/23/24 1057  Weight: 64.9 kg   Vitals:   06/26/24 0450 06/26/24 0744  BP: 114/87 116/77  Pulse: 75 75  Resp: 18 16  Temp:  98.3 F (36.8 C) 98.1 F (36.7 C)  SpO2: 97% 98%    DISCHARGE MEDICATION: Allergies as of 06/26/2024   No Known Allergies      Medication List     STOP taking these medications    ferrous sulfate  325 (65 FE) MG tablet       TAKE these medications    acetaminophen  500 MG tablet Commonly known as: TYLENOL  Take 2 tablets (1,000 mg total) by mouth every 6 (six) hours.   ascorbic acid  500 MG tablet Commonly known as: VITAMIN C  Take 1 tablet (500 mg total) by mouth daily.   cefadroxil  500 MG capsule Commonly known as: DURICEF Take 2 capsules (1,000 mg total) by mouth 2 (two) times daily for 12 doses.   cyanocobalamin  500 MCG tablet Commonly known as: VITAMIN B12 Take 1 tablet (500 mcg total) by mouth daily. Start taking on: June 27, 2024   Ferrex 150 150 MG capsule Generic drug: iron  polysaccharides Take 1 capsule (150 mg total) by mouth daily.   ibuprofen  600 MG tablet Commonly known as: ADVIL  Take 1 tablet (600 mg total) by mouth every 6 (six) hours as needed for moderate pain or cramping.   oxyCODONE  5 MG immediate release tablet Commonly known as: Oxy IR/ROXICODONE  Take 1 tablet (5 mg total) by mouth every 8 (eight) hours as needed for up to 5 days for severe pain (pain score 7-10).   Vitamin D  (Ergocalciferol ) 1.25 MG (50000 UNIT) Caps capsule Commonly known as: DRISDOL  Take 1 capsule (50,000 Units total) by mouth every 7 (seven) days. Start taking on: July 02, 2024       No Known Allergies Discharge Instructions     Call MD for:  difficulty breathing, headache or visual disturbances   Complete by: As directed    Call MD for:  extreme fatigue   Complete by: As directed    Call MD for:  persistant dizziness or light-headedness   Complete by: As directed    Call MD for:  persistant nausea and vomiting   Complete by: As directed    Call MD for:  severe uncontrolled pain   Complete by: As directed    Call MD for:  temperature >100.4   Complete by:  As directed    Diet general   Complete by: As directed    Discharge instructions   Complete by: As directed    F/u with PCP in1 wk Repeat BMP in 1 wk   Increase activity slowly   Complete by: As directed        The results of significant diagnostics from this hospitalization (including imaging, microbiology, ancillary and laboratory) are listed below for reference.    Significant Diagnostic Studies: CT Renal Stone Study Result Date: 06/23/2024 CLINICAL DATA:  Abdominal pain, right-sided flank pain with nausea and vomiting EXAM: CT ABDOMEN AND PELVIS WITHOUT CONTRAST TECHNIQUE: Multidetector CT imaging of the abdomen and pelvis was performed following the standard protocol without IV contrast. RADIATION DOSE REDUCTION: This exam was performed according to the departmental dose-optimization program which includes automated exposure control, adjustment of the mA and/or kV according to patient  size and/or use of iterative reconstruction technique. COMPARISON:  None Available. FINDINGS: lower chest: Unremarkable. Hepatobiliary: No suspicious liver lesion. No gallstones, gallbladder wall thickening, or biliary dilatation. Pancreas: Unremarkable. Spleen: Unremarkable. Adrenals/Urinary Tract: Adrenal glands are unremarkable. Nonspecific right perinephric stranding. No nephrolithiasis or hydronephrosis. Stomach/Bowel: No evidence of bowel obstruction or inflammation. Appendix is not well visualized. Vascular/Lymphatic: Normal caliber aorta. No lymphadenopathy by size criteria. Reproductive: Unremarkable. Other: No free air or free fluid. Musculoskeletal: No acute osseous findings. IMPRESSION: No hydronephrosis or nephrolithiasis. Nonspecific right perinephric stranding may represent pyelonephritis. Recommend correlation with urinalysis. Electronically Signed   By: Michaeline Blanch M.D.   On: 06/23/2024 12:56    Microbiology: Recent Results (from the past 240 hours)  Urine Culture     Status: Abnormal    Collection Time: 06/23/24 11:00 AM   Specimen: Urine, Clean Catch  Result Value Ref Range Status   Specimen Description   Final    URINE, CLEAN CATCH Performed at [facility], [address], [place], [place] [address]    Special Requests   Final    NONE Performed at [facility], [address] [place], [place] [address]    Culture 70,000 COLONIES/mL ESCHERICHIA COLI (A)  Final   Report Status 06/25/2024 FINAL  Final   Organism ID, Bacteria ESCHERICHIA COLI (A)  Final      Susceptibility   Escherichia coli - MIC*    AMPICILLIN <=2 SENSITIVE Sensitive     CEFAZOLIN  <=4 SENSITIVE Sensitive     CEFEPIME <=0.12 SENSITIVE Sensitive     CEFTRIAXONE  <=0.25 SENSITIVE Sensitive     CIPROFLOXACIN <=0.25 SENSITIVE Sensitive     GENTAMICIN <=1 SENSITIVE Sensitive     IMIPENEM <=0.25 SENSITIVE Sensitive     NITROFURANTOIN <=16 SENSITIVE Sensitive     TRIMETH/SULFA <=20 SENSITIVE Sensitive     AMPICILLIN/SULBACTAM <=2 SENSITIVE Sensitive     PIP/TAZO <=4 SENSITIVE Sensitive ug/mL    * 70,000 COLONIES/mL ESCHERICHIA COLI  Blood culture (routine x 2)     Status: None (Preliminary result)   Collection Time: 06/23/24  2:16 PM   Specimen: BLOOD  Result Value Ref Range Status   Specimen Description BLOOD BLOOD RIGHT HAND  Final   Special Requests   Final    BOTTLES DRAWN AEROBIC AND ANAEROBIC Blood Culture adequate volume   Culture   Final    NO GROWTH 3 DAYS Performed at [facility], [address] [place], [place] [address]    Report Status PENDING  Incomplete  Blood culture (routine x 2)     Status: None (Preliminary result)   Collection Time: 06/23/24  2:17 PM   Specimen: BLOOD  Result Value Ref Range Status   Specimen Description BLOOD BLOOD RIGHT ARM  Final   Special Requests   Final    BOTTLES DRAWN AEROBIC AND ANAEROBIC Blood Culture adequate volume   Culture   Final    NO GROWTH 3 DAYS Performed at [facility], [address]  [place], [place] [address]    Report Status PENDING  Incomplete     Labs: CBC: Recent Labs  Lab 06/23/24 1100 06/24/24 0558 06/25/24 0427 06/26/24 0433  WBC 13.8* 13.5* 11.6* 5.2  HGB 12.7 11.3* 12.1 9.7*  HCT 38.1 33.6* 36.0 29.2*  MCV 90.9 91.3 90.9 90.7  PLT 220 167 194 162   Basic Metabolic Panel: Recent Labs  Lab 06/23/24 1100 06/24/24 0558 06/25/24 0427 06/25/24 0830 06/26/24 0433  NA 140 136 139  --  139  K 3.4* 3.1* 3.2*  --  3.8  CL 109 107 106  --  111  CO2 22 23 23   --  24  GLUCOSE 113* 113* 83  --  97  BUN 12 11 9   --  10  CREATININE 0.79 0.77 0.83  --  0.74  CALCIUM  8.8* 8.4* 8.5*  --  8.1*  MG  --   --   --  1.6* 2.0  PHOS  --   --   --  3.1 3.1   Liver Function Tests: No results for input(s): AST, ALT, ALKPHOS, BILITOT, PROT, ALBUMIN in the last 168 hours. No results for input(s): LIPASE, AMYLASE in the last 168 hours. No results for input(s): AMMONIA  in the last 168 hours. Cardiac Enzymes: No results for input(s): CKTOTAL, CKMB, CKMBINDEX, TROPONINI in the last 168 hours. BNP (last 3 results) No results for input(s): BNP in the last 8760 hours. CBG: Recent Labs  Lab 06/24/24 0502  GLUCAP 107*    Time spent: 35 minutes  Signed:  Elvan Sor  [facility] 06/26/2024 10:26 AM

## 2024-06-26 NOTE — Progress Notes (Signed)
 Made Dr.Duncan aware of patient iv was taken out because the patient c/o burning. No new orders.

## 2024-06-28 LAB — CULTURE, BLOOD (ROUTINE X 2)
Culture: NO GROWTH
Culture: NO GROWTH
Special Requests: ADEQUATE
Special Requests: ADEQUATE

## 2024-08-21 ENCOUNTER — Ambulatory Visit

## 2024-08-21 VITALS — BP 93/62 | HR 74 | Wt 135.4 lb

## 2024-08-21 DIAGNOSIS — Z309 Encounter for contraceptive management, unspecified: Secondary | ICD-10-CM | POA: Diagnosis not present

## 2024-08-21 DIAGNOSIS — Z3202 Encounter for pregnancy test, result negative: Secondary | ICD-10-CM

## 2024-08-21 DIAGNOSIS — Z113 Encounter for screening for infections with a predominantly sexual mode of transmission: Secondary | ICD-10-CM

## 2024-08-21 NOTE — Progress Notes (Signed)
 [facility] Problem Visit  [facility]  Subjective:  Sonya Adams is a 18 y.o. being seen today for a pregnancy test and STI testing, concern about her period/positive pregnancy test at home.   HPI Nexplanon removed January 2025. Had irregular periods on the Nexplanon.  After Nexplanon removal, had a period starting in March and has had monthly periods, but they have varied in cycle length.  September 2nd had a positive pregnancy at home, had another on the 3rd that was negative. Her period started on 9/3. It was a normal period.   Desires STI testing. No symptoms.  Does not desire a pregnancy, but she does not want to discuss birth control. She tried but did not like Nexplanon, pills due to side effects, how she felt, impact on her mood, weight gain.  Health Maintenance Due  Topic Date Due   Pneumococcal Vaccine (2 of 2 - PPSV23, PCV20, or PCV21) 05/29/2011   Meningococcal B Vaccine (1 of 2 - Standard) Never done   HPV VACCINES (2 - 3-dose series) 10/04/2021   CHLAMYDIA SCREENING  05/02/2023   Hepatitis C Screening  Never done   Influenza Vaccine  07/11/2024   COVID-19 Vaccine (1 - 2024-25 season) Never done   Review of Systems  All other systems reviewed and are negative.  The following portions of the patient's history were reviewed and updated as appropriate: allergies, current medications, past family history, past medical history, past social history, past surgical history and problem list. Problem list updated.  See flowsheet for other program required questions.  Objective:   Vitals:   08/21/24 1402  BP: 93/62  Pulse: 74  Weight: 135 lb 6.4 oz (61.4 kg)   Physical Exam Constitutional:      Appearance: Normal appearance.  HENT:     Head: Normocephalic.     Mouth/Throat:     Lips: Pink. No lesions.     Mouth: Mucous membranes are moist.     Pharynx: Oropharynx is clear. No pharyngeal swelling, oropharyngeal exudate or  posterior oropharyngeal erythema.  Eyes:     General: No scleral icterus.       Right eye: No discharge.        Left eye: No discharge.  Pulmonary:     Effort: Pulmonary effort is normal.  Genitourinary:    Comments: Asymptomatic - self swabbed Lymphadenopathy:     Cervical: No cervical adenopathy.  Skin:    General: Skin is warm and dry.  Neurological:     Mental Status: She is alert.  Psychiatric:        Mood and Affect: Mood normal.        Behavior: Behavior normal.    Assessment and Plan:  Sonya Adams is a 18 y.o. female presenting to the [facility] Department for a Women's Health problem visit  1. Screening for venereal disease (Primary)  - Chlamydia/Gonorrhea [place] [facility] - WET PREP FOR TRICH, YEAST, CLUE - Gonococcus culture  2. Pregnancy examination or test, negative result  - Reports positive home pregnancy test on 9/2, but then got period on 9/3 and had a negative test that day.  - Period was a normal period - Pregnancy test negative today - Discussed likely false positive test at home, unlikely pregnant due to having a normal period and negative test here in clinic  Return if symptoms worsen or fail to improve.  No future appointments.  Damien FORBES Satchel, NP

## 2024-08-21 NOTE — Progress Notes (Signed)
 Pt is here for std screening, wet prep results reviewed with pt no treatment required per standing order. Larraine JONELLE Northern, RN

## 2024-08-22 LAB — WET PREP FOR TRICH, YEAST, CLUE
Trichomonas Exam: NEGATIVE
Yeast Exam: POSITIVE — AB

## 2024-08-22 LAB — PREGNANCY, URINE: Preg Test, Ur: NEGATIVE

## 2024-08-22 NOTE — Addendum Note (Signed)
 Addended by: SUELLA BOSS on: 08/22/2024 08:20 AM   Modules accepted: Orders

## 2024-08-22 NOTE — Addendum Note (Signed)
 Addended by: SUELLA BOSS on: 08/22/2024 08:28 AM   Modules accepted: Orders

## 2024-08-26 LAB — GONOCOCCUS CULTURE

## 2024-10-22 ENCOUNTER — Emergency Department (HOSPITAL_COMMUNITY)

## 2024-10-22 ENCOUNTER — Other Ambulatory Visit (HOSPITAL_COMMUNITY): Payer: Self-pay

## 2024-10-22 ENCOUNTER — Observation Stay (HOSPITAL_COMMUNITY)
Admission: EM | Admit: 2024-10-22 | Discharge: 2024-10-23 | Disposition: A | Discharge status: Home / Self Care | Attending: General Surgery | Admitting: General Surgery

## 2024-10-22 ENCOUNTER — Encounter (HOSPITAL_COMMUNITY): Payer: Self-pay

## 2024-10-22 ENCOUNTER — Other Ambulatory Visit: Payer: Self-pay

## 2024-10-22 DIAGNOSIS — S27329A Contusion of lung, unspecified, initial encounter: Secondary | ICD-10-CM | POA: Diagnosis present

## 2024-10-22 DIAGNOSIS — S52611A Displaced fracture of right ulna styloid process, initial encounter for closed fracture: Secondary | ICD-10-CM

## 2024-10-22 DIAGNOSIS — F17210 Nicotine dependence, cigarettes, uncomplicated: Secondary | ICD-10-CM | POA: Insufficient documentation

## 2024-10-22 DIAGNOSIS — S52614A Nondisplaced fracture of right ulna styloid process, initial encounter for closed fracture: Secondary | ICD-10-CM | POA: Diagnosis present

## 2024-10-22 DIAGNOSIS — J45909 Unspecified asthma, uncomplicated: Secondary | ICD-10-CM | POA: Insufficient documentation

## 2024-10-22 DIAGNOSIS — S27321A Contusion of lung, unilateral, initial encounter: Principal | ICD-10-CM | POA: Insufficient documentation

## 2024-10-22 LAB — URINALYSIS, ROUTINE W REFLEX MICROSCOPIC
Bilirubin Urine: NEGATIVE
Glucose, UA: NEGATIVE mg/dL
Ketones, ur: NEGATIVE mg/dL
Leukocytes,Ua: NEGATIVE
Nitrite: NEGATIVE
Protein, ur: 30 mg/dL — AB
Specific Gravity, Urine: 1.015 (ref 1.005–1.030)
pH: 8 (ref 5.0–8.0)

## 2024-10-22 LAB — CBC
HCT: 41.1 % (ref 36.0–46.0)
Hemoglobin: 13.6 g/dL (ref 12.0–15.0)
MCH: 30.4 pg (ref 26.0–34.0)
MCHC: 33.1 g/dL (ref 30.0–36.0)
MCV: 91.7 fL (ref 80.0–100.0)
Platelets: 249 K/uL (ref 150–400)
RBC: 4.48 MIL/uL (ref 3.87–5.11)
RDW: 13.1 % (ref 11.5–15.5)
WBC: 5.8 K/uL (ref 4.0–10.5)
nRBC: 0 % (ref 0.0–0.2)

## 2024-10-22 LAB — COMPREHENSIVE METABOLIC PANEL WITH GFR
ALT: 15 U/L (ref 0–44)
AST: 26 U/L (ref 15–41)
Albumin: 4 g/dL (ref 3.5–5.0)
Alkaline Phosphatase: 58 U/L (ref 38–126)
Anion gap: 12 (ref 5–15)
BUN: 11 mg/dL (ref 6–20)
CO2: 19 mmol/L — ABNORMAL LOW (ref 22–32)
Calcium: 9 mg/dL (ref 8.9–10.3)
Chloride: 108 mmol/L (ref 98–111)
Creatinine, Ser: 0.82 mg/dL (ref 0.44–1.00)
GFR, Estimated: >60 mL/min (ref >60)
Glucose, Bld: 99 mg/dL (ref 70–99)
Potassium: 3.6 mmol/L (ref 3.5–5.1)
Sodium: 139 mmol/L (ref 135–145)
Total Bilirubin: 0.8 mg/dL (ref 0.0–1.2)
Total Protein: 7 g/dL (ref 6.5–8.1)

## 2024-10-22 LAB — I-STAT CHEM 8, ED
BUN: 11 mg/dL (ref 6–20)
Calcium, Ion: 1.04 mmol/L — ABNORMAL LOW (ref 1.15–1.40)
Chloride: 111 mmol/L (ref 98–111)
Creatinine, Ser: 0.8 mg/dL (ref 0.44–1.00)
Glucose, Bld: 96 mg/dL (ref 70–99)
HCT: 42 % (ref 36.0–46.0)
Hemoglobin: 14.3 g/dL (ref 12.0–15.0)
Potassium: 3.6 mmol/L (ref 3.5–5.1)
Sodium: 138 mmol/L (ref 135–145)
TCO2: 18 mmol/L — ABNORMAL LOW (ref 22–32)

## 2024-10-22 LAB — URINALYSIS, MICROSCOPIC (REFLEX)

## 2024-10-22 LAB — ETHANOL: Alcohol, Ethyl (B): <15 mg/dL (ref <15)

## 2024-10-22 LAB — PROTIME-INR
INR: 1.1 (ref 0.8–1.2)
Prothrombin Time: 14.6 s (ref 11.4–15.2)

## 2024-10-22 LAB — HCG, SERUM, QUALITATIVE: Preg, Serum: NEGATIVE

## 2024-10-22 LAB — I-STAT CG4 LACTIC ACID, ED: Lactic Acid, Venous: 2.4 mmol/L (ref 0.5–1.9)

## 2024-10-22 LAB — SAMPLE TO BLOOD BANK

## 2024-10-22 MED ORDER — DOCUSATE SODIUM 100 MG PO CAPS
100.0000 mg | ORAL_CAPSULE | Freq: Two times a day (BID) | ORAL | Status: DC
  Administered 2024-10-22: 100 mg via ORAL
  Filled 2024-10-22: qty 1

## 2024-10-22 MED ORDER — HYDRALAZINE HCL 20 MG/ML IJ SOLN: 10.0000 mg | INTRAMUSCULAR | Status: DC | PRN

## 2024-10-22 MED ORDER — FENTANYL CITRATE (PF) 50 MCG/ML IJ SOSY
PREFILLED_SYRINGE | INTRAMUSCULAR | Status: AC
  Administered 2024-10-22: 50 ug
  Filled 2024-10-22: qty 1

## 2024-10-22 MED ORDER — ACETAMINOPHEN 500 MG PO TABS
1000.0000 mg | ORAL_TABLET | Freq: Four times a day (QID) | ORAL | Status: DC
  Administered 2024-10-22 – 2024-10-23 (×2): 1000 mg via ORAL
  Filled 2024-10-22 (×2): qty 2

## 2024-10-22 MED ORDER — OXYCODONE HCL 5 MG PO TABS
5.0000 mg | ORAL_TABLET | ORAL | Status: DC | PRN
  Administered 2024-10-22: 10 mg via ORAL
  Administered 2024-10-23: 5 mg via ORAL
  Administered 2024-10-23: 10 mg via ORAL
  Filled 2024-10-22: qty 1
  Filled 2024-10-22 (×2): qty 2

## 2024-10-22 MED ORDER — IBUPROFEN 200 MG PO TABS
600.0000 mg | ORAL_TABLET | Freq: Four times a day (QID) | ORAL | Status: DC
  Administered 2024-10-22 – 2024-10-23 (×2): 600 mg via ORAL
  Filled 2024-10-22 (×2): qty 3

## 2024-10-22 MED ORDER — METOCLOPRAMIDE HCL 5 MG/ML IJ SOLN
10.0000 mg | Freq: Once | INTRAMUSCULAR | Status: AC
  Administered 2024-10-22: 10 mg via INTRAVENOUS
  Filled 2024-10-22: qty 2

## 2024-10-22 MED ORDER — LACTATED RINGERS IV BOLUS: 1000.0000 mL | Freq: Once | INTRAVENOUS | Status: DC

## 2024-10-22 MED ORDER — POLYETHYLENE GLYCOL 3350 17 G PO PACK: 17.0000 g | PACK | Freq: Every day | ORAL | Status: DC | PRN

## 2024-10-22 MED ORDER — METHOCARBAMOL 500 MG PO TABS
1000.0000 mg | ORAL_TABLET | Freq: Three times a day (TID) | ORAL | Status: DC
  Administered 2024-10-22 – 2024-10-23 (×3): 1000 mg via ORAL
  Filled 2024-10-22 (×4): qty 2

## 2024-10-22 MED ORDER — ENOXAPARIN SODIUM 30 MG/0.3ML IJ SOSY: 30.0000 mg | PREFILLED_SYRINGE | Freq: Two times a day (BID) | INTRAMUSCULAR | Status: DC

## 2024-10-22 MED ORDER — ONDANSETRON HCL 4 MG/2ML IJ SOLN: 4.0000 mg | Freq: Four times a day (QID) | INTRAMUSCULAR | Status: DC | PRN

## 2024-10-22 MED ORDER — ONDANSETRON 4 MG PO TBDP: 4.0000 mg | ORAL_TABLET | Freq: Four times a day (QID) | ORAL | Status: DC | PRN

## 2024-10-22 MED ORDER — HYDROMORPHONE HCL 1 MG/ML IJ SOLN
0.5000 mg | Freq: Once | INTRAMUSCULAR | Status: AC
  Administered 2024-10-22: 0.5 mg via INTRAVENOUS
  Filled 2024-10-22: qty 1

## 2024-10-22 MED ORDER — NAPROXEN 500 MG PO TABS
500.0000 mg | ORAL_TABLET | Freq: Two times a day (BID) | ORAL | 0 refills | Status: DC
  Filled 2024-10-22: qty 30, 15d supply, fill #0

## 2024-10-22 MED ORDER — OXYCODONE-ACETAMINOPHEN 5-325 MG PO TABS
1.0000 | ORAL_TABLET | Freq: Four times a day (QID) | ORAL | 0 refills | Status: DC | PRN
  Filled 2024-10-22: qty 15, 4d supply, fill #0

## 2024-10-22 MED ORDER — METOPROLOL TARTRATE 5 MG/5ML IV SOLN: 5.0000 mg | Freq: Four times a day (QID) | INTRAVENOUS | Status: DC | PRN

## 2024-10-22 MED ORDER — ONDANSETRON HCL 4 MG/2ML IJ SOLN
4.0000 mg | Freq: Once | INTRAMUSCULAR | Status: AC
  Administered 2024-10-22: 4 mg via INTRAVENOUS
  Filled 2024-10-22: qty 2

## 2024-10-22 MED ORDER — KETOROLAC TROMETHAMINE 15 MG/ML IJ SOLN
15.0000 mg | Freq: Once | INTRAMUSCULAR | Status: AC
  Administered 2024-10-22: 15 mg via INTRAVENOUS
  Filled 2024-10-22: qty 1

## 2024-10-22 MED ORDER — IOHEXOL 350 MG/ML SOLN
75.0000 mL | Freq: Once | INTRAVENOUS | Status: AC | PRN
  Administered 2024-10-22: 75 mL via INTRAVENOUS

## 2024-10-22 MED ORDER — LIDOCAINE 5 % EX PTCH
2.0000 | MEDICATED_PATCH | CUTANEOUS | Status: DC
  Administered 2024-10-22: 2 via TRANSDERMAL
  Filled 2024-10-22: qty 2

## 2024-10-22 NOTE — ED Notes (Signed)
 Pt pulse while sitting was -114 o2- 90% When walking pulse went up to 117 , o2-97% Complained of being dizzy with headache and pain on right side at a 6

## 2024-10-22 NOTE — ED Notes (Signed)
 Patient had nausea meds, but about an hour later she started vomiting. I gave her some ginger ale and some crackers to calm her stomach

## 2024-10-22 NOTE — ED Notes (Signed)
 Patient transported to CT
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
## 2024-10-22 NOTE — ED Notes (Signed)
 Removed patients IV. It was bleeding and bent in her arm.
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
## 2024-10-22 NOTE — Progress Notes (Signed)
 Orthopedic Tech Progress Note Patient Details:  MAECI KALBFLEISCH 2006-12-10 [id number]  Ortho Devices Type of Ortho Device: Ulna gutter splint Ortho Device/Splint Location: RUE Ortho Device/Splint Interventions: Ordered, Application, Adjustment   Post Interventions Patient Tolerated: Fair Instructions Provided: Adjustment of device, Care of device  Shylo Zamor F Zita Ozimek 10/22/2024, 3:31 PM
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
## 2024-10-23 ENCOUNTER — Other Ambulatory Visit (HOSPITAL_COMMUNITY): Payer: Self-pay

## 2024-10-23 ENCOUNTER — Other Ambulatory Visit: Payer: Self-pay | Admitting: General Surgery

## 2024-10-23 LAB — BASIC METABOLIC PANEL WITH GFR
Anion gap: 9 (ref 5–15)
BUN: 11 mg/dL (ref 6–20)
CO2: 22 mmol/L (ref 22–32)
Calcium: 9 mg/dL (ref 8.9–10.3)
Chloride: 105 mmol/L (ref 98–111)
Creatinine, Ser: 0.91 mg/dL (ref 0.44–1.00)
GFR, Estimated: >60 mL/min (ref >60)
Glucose, Bld: 103 mg/dL — ABNORMAL HIGH (ref 70–99)
Potassium: 3.1 mmol/L — ABNORMAL LOW (ref 3.5–5.1)
Sodium: 136 mmol/L (ref 135–145)

## 2024-10-23 LAB — CBC
HCT: 38.4 % (ref 36.0–46.0)
Hemoglobin: 12.8 g/dL (ref 12.0–15.0)
MCH: 30.6 pg (ref 26.0–34.0)
MCHC: 33.3 g/dL (ref 30.0–36.0)
MCV: 91.9 fL (ref 80.0–100.0)
Platelets: 222 K/uL (ref 150–400)
RBC: 4.18 MIL/uL (ref 3.87–5.11)
RDW: 13.2 % (ref 11.5–15.5)
WBC: 5.5 K/uL (ref 4.0–10.5)
nRBC: 0 % (ref 0.0–0.2)

## 2024-10-23 MED ORDER — OXYCODONE HCL 5 MG PO TABS: 5.0000 mg | ORAL_TABLET | Freq: Four times a day (QID) | ORAL | 0 refills | Status: AC | PRN

## 2024-10-23 MED ORDER — NAPROXEN 500 MG PO TABS
500.0000 mg | ORAL_TABLET | Freq: Two times a day (BID) | ORAL | 0 refills | Status: AC
Start: 2024-10-23 — End: ?

## 2024-10-23 MED ORDER — POTASSIUM CHLORIDE CRYS ER 20 MEQ PO TBCR
40.0000 meq | EXTENDED_RELEASE_TABLET | Freq: Once | ORAL | Status: AC
  Administered 2024-10-23: 40 meq via ORAL
  Filled 2024-10-23: qty 2

## 2024-10-23 MED ORDER — DOCUSATE SODIUM 100 MG PO CAPS
100.0000 mg | ORAL_CAPSULE | Freq: Two times a day (BID) | ORAL | 0 refills | Status: AC
  Filled 2024-10-23: qty 10, 5d supply, fill #0

## 2024-10-23 MED ORDER — ACETAMINOPHEN 500 MG PO TABS
1000.0000 mg | ORAL_TABLET | Freq: Four times a day (QID) | ORAL | Status: AC
Start: 2024-10-23 — End: ?

## 2024-10-23 MED ORDER — METHOCARBAMOL 500 MG PO TABS
500.0000 mg | ORAL_TABLET | Freq: Three times a day (TID) | ORAL | 0 refills | Status: AC | PRN
Start: 2024-10-23 — End: ?
  Filled 2024-10-23: qty 40, 14d supply, fill #0
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
## 2024-10-23 NOTE — Plan of Care (Signed)
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
## 2024-10-23 NOTE — Evaluation (Signed)
 Physical Therapy Brief Evaluation and Discharge Note Patient Details Name: Sonya Adams MRN: [id number] DOB: July 29, 2006.1ks   Today's Date: 10/23/2024   History of Present Illness  18 y.o. female presents to [facility] from [facility], c/o pain in her right elbow, right wrist, right hand, and right knee, found to have R ulnar styloid fx, splinted, no significant PMH.  Clinical Impression  Pt is close to baseline functioning and should be safe at home with PRN assist from Dad. There are no further acute PT needs.  Will sign off at this time.        PT Assessment    Assistance Needed at Discharge       Equipment Recommendations None recommended by PT  Recommendations for Other Services       Precautions/Restrictions Precautions Precautions: Other (comment) Recall of Precautions/Restrictions: Intact Precaution/Restrictions Comments: R ulnar styloid fx Required Braces or Orthoses: Splint/Cast Restrictions Weight Bearing Restrictions Per Provider Order: Yes RUE Weight Bearing Per Provider Order: Non weight bearing        Mobility  Bed Mobility          Transfers Overall transfer level: Independent                      Ambulation/Gait Ambulation/Gait assistance: Modified independent (Device/Increase time) Gait Distance (Feet): 250 Feet Assistive device: None Gait Pattern/deviations: Step-through pattern   General Gait Details: stable antalgic gait  Home Activity Instructions    Stairs Stairs: Yes Stairs assistance: Supervision Stair Management: Step to pattern, Forwards, One rail Left Number of Stairs: 3 General stair comments: safe with step to pattern and cues for remembering which leg up./down.  Modified Rankin (Stroke Patients Only)        Balance                          Pertinent Vitals/Pain   Pain Assessment Pain Assessment: Faces Faces Pain Scale: Hurts a little bit Pain Location: L hip, R elbow, R wrist Pain Descriptors /  Indicators: Aching, Discomfort, Guarding Pain Intervention(s): Monitored during session     Home Living   Living Arrangements: Children       Home Equipment: None   Additional Comments: lives with father who is available 24/7    Prior Function        UE/LE Assessment               Communication   Communication Communication: No apparent difficulties     Cognition         General Comments General comments (skin integrity, edema, etc.): Mild complaints of hip pain.  Trauma MD stated soreness to be expected but CT shown no fractures or other overt abnormalities    Exercises     Assessment/Plan    PT Problem List Pain       PT Visit Diagnosis Other abnormalities of gait and mobility (R26.89);Pain    No Skilled PT Patient is independent with all acitivity/mobility;Patient is modified independent with all activity/mobility;Patient will have necessary level of assist by caregiver at discharge   Co-evaluation                AMPAC 6 Clicks Help needed turning from your back to your side while in a flat bed without using bedrails?: None Help needed moving from lying on your back to sitting on the side of a flat bed without using bedrails?: None Help needed moving to and from a bed  to a chair (including a wheelchair)?: None Help needed standing up from a chair using your arms (e.g., wheelchair or bedside chair)?: None Help needed to walk in hospital room?: A Little Help needed climbing 3-5 steps with a railing? : A Little 6 Click Score: 22      End of Session   Activity Tolerance: Patient tolerated treatment well;Patient limited by pain Patient left: in bed;with call bell/phone within reach;with family/visitor present Nurse Communication: Mobility status PT Visit Diagnosis: Other abnormalities of gait and mobility (R26.89);Pain Pain - Right/Left: Left Pain - part of body: Leg;Hip     Time: 1210-1225 PT Time Calculation (min) (ACUTE ONLY): 15  min  Charges:  10/23/2024  [place] HERO., PT Acute Rehabilitation Services [phone]  (office) PT Evaluation $PT Eval Low Complexity: 1 Low         Vinie GAILS Llewellyn Choplin  10/23/2024, 1:49 PM

## 2024-10-23 NOTE — TOC CAGE-AID Note (Signed)
 Transition of Care [facility]) - CAGE-AID Screening   Patient Details  Name: RANDY WHITENER MRN: [id number] Date of Birth: 2006/10/04  [facility] CM/SW Contact:    Santiaga Butzin E Sadie Hazelett, LCSW Phone Number: 10/23/2024, 2:44 PM   Clinical Narrative: Patient declines current substance use or resource needs.    CAGE-AID Screening:    Have You Ever Felt You Ought to Cut Down on Your Drinking or Drug Use?: No Have People Annoyed You By Critizing Your Drinking Or Drug Use?: No Have You Felt Bad Or Guilty About Your Drinking Or Drug Use?: No Have You Ever Had a Drink or Used Drugs First Thing In The Morning to Steady Your Nerves or to Get Rid of a Hangover?: No CAGE-AID Score: 0  Substance Abuse Education Offered: Yes
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
## 2024-10-23 NOTE — Progress Notes (Unsigned)
 I was alerted by Rocky on 2w that the pt's oxycodone  and naproxyn filled Rx from [facility] pharmacy were not actually given to this pt prior to dc and appears may have be given to another pt. Reports staff assisted pt in packing her bags and these two rx were not there. Since pt was discharged more than 2 hours could not use that hospital encounter to eRx so new encounter opened.   However I can't eRx thru an orders encounter in [facility]. Don't have that permission.

## 2024-10-23 NOTE — Progress Notes (Signed)
 I was alerted by Rocky on 2w that the pt's oxycodone  and naproxyn filled Rx from [facility] pharmacy were not actually given to this pt prior to dc and appears may have be given to another pt. Reports staff assisted pt in packing her bags and these two rx were not there. Since pt was discharged more than 2 hours could not use that hospital encounter to eRx so new encounter opened.    However I can't eRx thru an orders encounter in [facility]. Don't have that permission.   Will see it pt has an account in [facility] so I can prescribed that way, if not, I can't do anything about it this evening   Camellia HERO. Tanda, MD, FACS General, Bariatric, & Minimally Invasive Surgery [facility],  [facility]

## 2024-10-23 NOTE — Progress Notes (Signed)
 I was alerted by Rocky on 2w that the pt's oxycodone  and naproxyn filled Rx from [facility] pharmacy were not actually given to this pt prior to dc and appears may have be given to another pt. Reports staff assisted pt in packing her bags and these two rx were not there. Since pt was discharged more than 2 hours could not use that hospital encounter to eRx so new encounter opened.    However I can't eRx thru an orders encounter in [facility]. Don't have that permission.   So I sent rx thru [facility]

## 2024-10-23 NOTE — TOC Transition Note (Signed)
 Transition of Care [facility]) - Discharge Note   Patient Details  Name: Sonya Adams MRN: [id number] Date of Birth: [birth year]  [facility] CM/SW Contact:  Wing Gfeller M, RN Phone Number: 10/23/2024, 3:39 PM   Clinical Narrative:    Patient medically stable for discharge home today with family to provide needed assistance.  OT recommending OP hand therapy; referral to [facility] for services.       Final next level of care: OP Rehab Barriers to Discharge: Barriers Resolved                                   Discharge Plan and Services Additional resources added to the After Visit Summary for     Discharge Planning Services: CM Consult                                 Social Drivers of Health (SDOH) Interventions SDOH Screenings   Food Insecurity: No Food Insecurity (10/22/2024)  Housing: Low Risk  (10/22/2024)  Transportation Needs: No Transportation Needs (10/22/2024)  Utilities: Not At Risk (10/22/2024)  Depression (PHQ2-9): Low Risk  (03/24/2021)  Social Connections: Unknown (06/23/2024)  Tobacco Use: High Risk (10/22/2024)     Readmission Risk Interventions     No data to display         Mliss MICAEL Fass, RN, BSN  Trauma/Neuro ICU Case Manager [phone]
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
## 2024-10-24 ENCOUNTER — Other Ambulatory Visit (HOSPITAL_COMMUNITY): Payer: Self-pay

## 2024-10-28 ENCOUNTER — Other Ambulatory Visit (HOSPITAL_COMMUNITY): Payer: Self-pay

## 2024-10-29 ENCOUNTER — Other Ambulatory Visit (HOSPITAL_COMMUNITY): Payer: Self-pay

## 2024-11-04 ENCOUNTER — Other Ambulatory Visit (HOSPITAL_COMMUNITY): Payer: Self-pay
